# Patient Record
Sex: Female | Born: 1960 | Race: White | Hispanic: No | Marital: Married | Smoking: Never smoker
Health system: Southern US, Community
[De-identification: ages and names within clinical notes are randomized; demographics above are authoritative.]

## PROBLEM LIST (undated history)

## (undated) DIAGNOSIS — I10 Essential (primary) hypertension: Secondary | ICD-10-CM

## (undated) HISTORY — PX: ABDOMINAL HYSTERECTOMY: SHX81

---

## 2018-03-12 ENCOUNTER — Emergency Department
Admission: EM | Admit: 2018-03-12 | Discharge: 2018-03-12 | Disposition: A | Discharge status: Home / Self Care | Payer: Medicare FFS | Source: Home / Self Care

## 2018-03-12 ENCOUNTER — Emergency Department (INDEPENDENT_AMBULATORY_CARE_PROVIDER_SITE_OTHER): Payer: Medicare FFS

## 2018-03-12 ENCOUNTER — Encounter: Payer: Self-pay | Admitting: Emergency Medicine

## 2018-03-12 DIAGNOSIS — W230XXA Caught, crushed, jammed, or pinched between moving objects, initial encounter: Secondary | ICD-10-CM | POA: Diagnosis not present

## 2018-03-12 DIAGNOSIS — S6992XA Unspecified injury of left wrist, hand and finger(s), initial encounter: Secondary | ICD-10-CM

## 2018-03-12 DIAGNOSIS — S61311A Laceration without foreign body of left index finger with damage to nail, initial encounter: Secondary | ICD-10-CM

## 2018-03-12 HISTORY — DX: Essential (primary) hypertension: I10

## 2018-03-12 NOTE — Discharge Instructions (Addendum)
Return in 7 days for suture removal.

## 2018-03-12 NOTE — ED Triage Notes (Signed)
Patient states that she closed her left index finger in a door of a chicken house last PM and in another door inside her home today. Last tetanus 3 years ago.

## 2018-03-14 NOTE — ED Provider Notes (Addendum)
[facility]    CSN: [id number] Arrival date & time: 03/12/18  1631     History   Chief Complaint Chief Complaint  Patient presents with  . Laceration    HPI Jenna Underwood is a 57 y.o. female.   The history is provided by the patient. No language interpreter was used.  Laceration  Location:  Finger Finger laceration location:  L index finger Length:  1 Quality: avulsion   Bleeding: controlled   Laceration mechanism:  Unable to specify Pain details:    Quality:  Aching   Severity:  No pain   Timing:  Constant   Progression:  Worsening Foreign body present:  No foreign bodies Relieved by:  Nothing Worsened by:  Nothing Ineffective treatments:  None tried Tetanus status:  Up to date Pt complains of a laceration to tip of her finger.  Pt closed door on the tip of her finger.  Pt complains of pain and bleeding   Past Medical History:  Diagnosis Date  . Hypertension     There are no active problems to display for this patient.   Past Surgical History:  Procedure Laterality Date  . ABDOMINAL HYSTERECTOMY      OB History   None      Home Medications    Prior to Admission medications   Medication Sig Start Date End Date Taking? Authorizing Provider  cholecalciferol (VITAMIN D) 1000 units tablet Take 1,000 Units by mouth once a week.   Yes [provider]  levothyroxine (SYNTHROID) 100 MCG tablet Take 100 mcg by mouth daily before breakfast.   Yes [provider]  zolpidem (AMBIEN) 5 MG tablet Take 5 mg by mouth at bedtime as needed for sleep.   Yes [provider]    Family History History reviewed. No pertinent family history.  Social History Social History   Tobacco Use  . Smoking status: Never Smoker  . Smokeless tobacco: Never Used  Substance Use Topics  . Alcohol use: Never    Frequency: Never  . Drug use: Never     Allergies   Morphine and related   Review of Systems Review of Systems  All other  systems reviewed and are negative.    Physical Exam Triage Vital Signs ED Triage Vitals  Enc Vitals Group     BP 03/12/18 1647 (!) 151/98     Pulse Rate 03/12/18 1647 88     Resp 03/12/18 1647 16     Temp 03/12/18 1647 97.8 F (36.6 C)     Temp Source 03/12/18 1647 Oral     SpO2 03/12/18 1647 97 %     Weight 03/12/18 1648 158 lb (71.7 kg)     Height 03/12/18 1648 5\' 7"  (1.702 m)     Head Circumference --      Peak Flow --      Pain Score 03/12/18 1648 4     Pain Loc --      Pain Edu? --      Excl. in GC? --    No data found.  Updated Vital Signs BP (!) 151/98 (BP Location: Right Arm)   Pulse 88   Temp 97.8 F (36.6 C) (Oral)   Resp 16   Ht 5\' 7"  (1.702 m)   Wt 158 lb (71.7 kg)   SpO2 97%   BMI 24.75 kg/m   Visual Acuity Right Eye Distance:   Left Eye Distance:   Bilateral Distance:    Right Eye Near:  Left Eye Near:    Bilateral Near:     Physical Exam  Constitutional: She appears well-developed and well-nourished.  HENT:  Head: Normocephalic.  Musculoskeletal: Normal range of motion.  1cm laceration distal tip of left index finger  Neurological: She is alert.  Skin: Skin is warm.  Psychiatric: She has a normal mood and affect.  Nursing note and vitals reviewed.    UC Treatments / Results  Labs (all labs ordered are listed, but only abnormal results are displayed) Labs Reviewed - No data to display  EKG None Radiology Dg Finger Index Left  Result Date: 03/12/2018 CLINICAL DATA:  Pain after trauma EXAM: LEFT INDEX FINGER 2+V COMPARISON:  None. FINDINGS: There is no evidence of fracture or dislocation. There is no evidence of arthropathy or other focal bone abnormality. Soft tissues are unremarkable. IMPRESSION: Negative. Electronically Signed   By: Gerome Sam III M.D   On: 03/12/2018 17:37    Procedures Laceration Repair Date/Time: 03/14/2018 8:35 AM Performed by: Elson Areas, PA-C Authorized by: Elson Areas, PA-C   Consent:      Consent obtained:  Verbal   Consent given by:  Patient Laceration details:    Location:  Finger   Finger location:  L index finger   Length (cm):  1   Depth (mm):  5 Repair type:    Repair type:  Simple Pre-procedure details:    Preparation:  Patient was prepped and draped in usual sterile fashion Exploration:    Wound exploration: wound explored through full range of motion and entire depth of wound probed and visualized     Contaminated: no   Treatment:    Area cleansed with:  Betadine   Irrigation method:  Syringe Skin repair:    Repair method:  Sutures   Suture material:  Nylon   Suture technique:  Simple interrupted   Number of sutures:  3 Approximation:    Approximation:  Loose Post-procedure details:    Dressing:  Non-adherent dressing   (including critical care time)  Medications Ordered in UC Medications - No data to display   Initial Impression / Assessment and Plan / UC Course  I have reviewed the triage vital signs and the nursing notes.  Pertinent labs & imaging results that were available during my care of the patient were reviewed by me and considered in my medical decision making (see chart for details).     Suutre removal in 8 days  Final Clinical Impressions(s) / UC Diagnoses   Final diagnoses:  Laceration of left index finger with damage to nail, foreign body presence unspecified, initial encounter    ED Discharge Orders    None     An After Visit Summary was printed and given to the patient.   Controlled Substance Prescriptions [place] Controlled Substance Registry consulted? Not Applicable   Osie Cheeks 03/14/18 1610    Elson Areas, PA-C 03/14/18 [phone]

## 2018-03-16 ENCOUNTER — Telehealth: Payer: Self-pay | Admitting: Emergency Medicine

## 2018-03-16 NOTE — Telephone Encounter (Signed)
Patient states her finger is sore; taking Aleve; denies any signs of infection. Will return for suture removal as suggested.

## 2018-03-19 ENCOUNTER — Other Ambulatory Visit: Payer: Self-pay

## 2018-03-19 ENCOUNTER — Encounter: Payer: Self-pay | Admitting: Emergency Medicine

## 2018-03-19 ENCOUNTER — Emergency Department (INDEPENDENT_AMBULATORY_CARE_PROVIDER_SITE_OTHER): Payer: Medicare FFS

## 2018-03-19 ENCOUNTER — Emergency Department (INDEPENDENT_AMBULATORY_CARE_PROVIDER_SITE_OTHER)
Admission: EM | Admit: 2018-03-19 | Discharge: 2018-03-19 | Disposition: A | Discharge status: Home / Self Care | Payer: Medicare FFS | Source: Home / Self Care | Attending: Emergency Medicine | Admitting: Emergency Medicine

## 2018-03-19 DIAGNOSIS — Z23 Encounter for immunization: Secondary | ICD-10-CM | POA: Diagnosis not present

## 2018-03-19 DIAGNOSIS — S61209D Unspecified open wound of unspecified finger without damage to nail, subsequent encounter: Secondary | ICD-10-CM

## 2018-03-19 DIAGNOSIS — M50322 Other cervical disc degeneration at C5-C6 level: Secondary | ICD-10-CM | POA: Diagnosis not present

## 2018-03-19 DIAGNOSIS — S161XXA Strain of muscle, fascia and tendon at neck level, initial encounter: Secondary | ICD-10-CM | POA: Diagnosis not present

## 2018-03-19 MED ORDER — TETANUS-DIPHTH-ACELL PERTUSSIS 5-2.5-18.5 LF-MCG/0.5 IM SUSP
0.5000 mL | Freq: Once | INTRAMUSCULAR | Status: AC
  Administered 2018-03-19: 0.5 mL via INTRAMUSCULAR

## 2018-03-19 MED ORDER — MUPIROCIN CALCIUM 2 % EX CREA: 1.0000 "application " | TOPICAL_CREAM | Freq: Two times a day (BID) | CUTANEOUS | 0 refills | Status: DC

## 2018-03-19 NOTE — Discharge Instructions (Addendum)
Clean finger with soap and water 3 times a day. Use Bactroban ointment 3 times a day. I will call you with culture results.
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
## 2018-03-22 LAB — WOUND CULTURE
MICRO NUMBER: 90400131
SPECIMEN QUALITY: ADEQUATE

## 2022-03-01 ENCOUNTER — Emergency Department (INDEPENDENT_AMBULATORY_CARE_PROVIDER_SITE_OTHER): Payer: Medicare FFS

## 2022-03-01 ENCOUNTER — Other Ambulatory Visit: Payer: Self-pay

## 2022-03-01 ENCOUNTER — Emergency Department
Admission: EM | Admit: 2022-03-01 | Discharge: 2022-03-01 | Disposition: A | Discharge status: Home / Self Care | Payer: Medicare FFS | Source: Home / Self Care | Attending: Family Medicine | Admitting: Family Medicine

## 2022-03-01 DIAGNOSIS — M79671 Pain in right foot: Secondary | ICD-10-CM | POA: Diagnosis not present

## 2022-03-01 DIAGNOSIS — S9031XA Contusion of right foot, initial encounter: Secondary | ICD-10-CM

## 2022-03-01 DIAGNOSIS — W19XXXA Unspecified fall, initial encounter: Secondary | ICD-10-CM

## 2022-03-01 MED ORDER — TRAMADOL HCL 50 MG PO TABS: 50.0000 mg | ORAL_TABLET | Freq: Four times a day (QID) | ORAL | 0 refills | Status: AC | PRN

## 2022-03-01 NOTE — Discharge Instructions (Signed)
Use ice and elevation to reduce pain and swelling ?Take tramadol as needed for severe pain ?Limit walking while ankle is painful ?See your doctor if not improving by next week ?

## 2022-03-01 NOTE — ED Provider Notes (Signed)
?[facility] ? ? ? ?CSN: [id number] ?Arrival date & time: 03/01/22  1630 ? ? ?  ? ?History   ?Chief Complaint ?Chief Complaint  ?Patient presents with  ? Foot Pain  ?  RT, swelling  ? ? ?HPI ?Jenna Underwood is a 61 y.o. female.  ? ?HPI ?Patient states she fell in her home 4 days ago and injured her left foot.  Tripped over a dog.  The outside of her foot and ankle is swollen and very painful.  Pain with weightbearing.  No history of fracture or injury in the past.  No history of osteoporosis.  Patient is disabled with chronic pain and fibromyalgia ?Past Medical History:  ?Diagnosis Date  ? Hypertension   ? ? ?There are no problems to display for this patient. ? ? ?Past Surgical History:  ?Procedure Laterality Date  ? ABDOMINAL HYSTERECTOMY    ? ? ?OB History   ?No obstetric history on file. ?  ? ? ? ?Home Medications   ? ?Prior to Admission medications   ?Medication Sig Start Date End Date Taking? Authorizing Provider  ?Olopatadine HCl 0.2 % SOLN Place one drop into both eyes daily for 30 days. 10/07/20  Yes [provider]  ?venlafaxine XR (EFFEXOR-XR) 150 MG 24 hr capsule TAKE 1 CAPSULE WITH BREAKFAST 07/18/20  Yes [provider]  ?cholecalciferol (VITAMIN D) 1000 units tablet Take 1,000 Units by mouth once a week.    [provider]  ?furosemide (LASIX) 20 MG tablet Take 20 mg by mouth daily as needed. 11/05/21   [provider]  ?levothyroxine (SYNTHROID) 100 MCG tablet Take 100 mcg by mouth daily before breakfast.    [provider]  ?traMADol (ULTRAM) 50 MG tablet Take 1 tablet (50 mg total) by mouth every 6 (six) hours as needed. 03/01/22  Yes Eustace Moore, MD  ?zolpidem (AMBIEN) 5 MG tablet Take 5 mg by mouth at bedtime as needed for sleep.    [provider]  ? ? ?Family History ?History reviewed. No pertinent family history. ? ?Social History ?Social History  ? ?Tobacco Use  ? Smoking status: Never  ? Smokeless tobacco: Never  ?Substance Use  Topics  ? Alcohol use: Never  ? Drug use: Never  ? ? ? ?Allergies   ?Morphine and related and Gabapentin ? ? ?Review of Systems ?Review of Systems ? ?See HPI ?Physical Exam ?Triage Vital Signs ?ED Triage Vitals  ?Enc Vitals Group  ?   BP 03/01/22 1643 121/73  ?   Pulse Rate 03/01/22 1643 85  ?   Resp 03/01/22 1643 18  ?   Temp 03/01/22 1643 98.3 ?F (36.8 ?C)  ?   Temp Source 03/01/22 1643 Oral  ?   SpO2 03/01/22 1643 97 %  ?   Weight --   ?   Height --   ?   Head Circumference --   ?   Peak Flow --   ?   Pain Score 03/01/22 1644 5  ?   Pain Loc --   ?   Pain Edu? --   ?   Excl. in GC? --   ? ?No data found. ? ?Updated Vital Signs ?BP 121/73 (BP Location: Right Arm)   Pulse 85   Temp 98.3 ?F (36.8 ?C) (Oral)   Resp 18   SpO2 97%  ?  ? ?Physical Exam ?Constitutional:   ?   General: She is not in acute distress. ?   Appearance: She is  well-developed.  ?HENT:  ?   Head: Normocephalic and atraumatic.  ?   Nose:  ?   Comments: Mask is in place ?Eyes:  ?   Conjunctiva/sclera: Conjunctivae normal.  ?   Pupils: Pupils are equal, round, and reactive to light.  ?Cardiovascular:  ?   Rate and Rhythm: Normal rate.  ?Pulmonary:  ?   Effort: Pulmonary effort is normal. No respiratory distress.  ?Abdominal:  ?   General: There is no distension.  ?   Palpations: Abdomen is soft.  ?Musculoskeletal:     ?   General: Normal range of motion.  ?   Cervical back: Normal range of motion.  ?     Feet: ? ?Skin: ?   General: Skin is warm and dry.  ?Neurological:  ?   Mental Status: She is alert.  ?   Gait: Gait abnormal.  ?Psychiatric:     ?   Mood and Affect: Mood normal.     ?   Behavior: Behavior normal.  ? ? ? ?UC Treatments / Results  ?Labs ?(all labs ordered are listed, but only abnormal results are displayed) ?Labs Reviewed - No data to display ? ?EKG ? ? ?Radiology ?DG Foot Complete Right ? ?Result Date: 03/01/2022 ?CLINICAL DATA:  Trip and fall, lateral foot pain EXAM: RIGHT FOOT COMPLETE - 3+ VIEW COMPARISON:  None. FINDINGS:  There is no evidence of fracture or dislocation. There is no evidence of arthropathy or other focal bone abnormality. Soft tissues are unremarkable. IMPRESSION: No fracture or dislocation of the right foot. Joint spaces are preserved. Electronically Signed   By: Jearld Lesch M.D.   On: 03/01/2022 17:05   ? ?Procedures ?Procedures (including critical care time) ? ?Medications Ordered in UC ?Medications - No data to display ? ?Initial Impression / Assessment and Plan / UC Course  ?I have reviewed the triage vital signs and the nursing notes. ? ?Pertinent labs & imaging results that were available during my care of the patient were reviewed by me and considered in my medical decision making (see chart for details). ? ?  ? ?Final Clinical Impressions(s) / UC Diagnoses  ? ?Final diagnoses:  ?Contusion of right foot, initial encounter  ? ? ? ?Discharge Instructions   ? ?  ?Use ice and elevation to reduce pain and swelling ?Take tramadol as needed for severe pain ?Limit walking while ankle is painful ?See your doctor if not improving by next week ? ? ?ED Prescriptions   ? ? Medication Sig Dispense Auth. Provider  ? traMADol (ULTRAM) 50 MG tablet Take 1 tablet (50 mg total) by mouth every 6 (six) hours as needed. 15 tablet Eustace Moore, MD  ? ?  ? ?I have reviewed the PDMP during this encounter. ?  ?Eustace Moore, MD ?03/01/22 1745 ? ?

## 2022-03-01 NOTE — ED Triage Notes (Signed)
Pt c/o LT foot pain since Sat evening. Says she tripped over one of her dogs hitting the side of her foot on her back deck. Pain 5/10 Advil and ibuprofen prn. Ice and epsom salt prn. ?

## 2022-10-12 IMAGING — DX DG FOOT COMPLETE 3+V*R*
3 series · 3 of 3 positions shown · non-contrast
Comparison: None.

CLINICAL DATA: Trip and fall, lateral foot pain

EXAM:
RIGHT FOOT COMPLETE - 3+ VIEW

[foot ap]
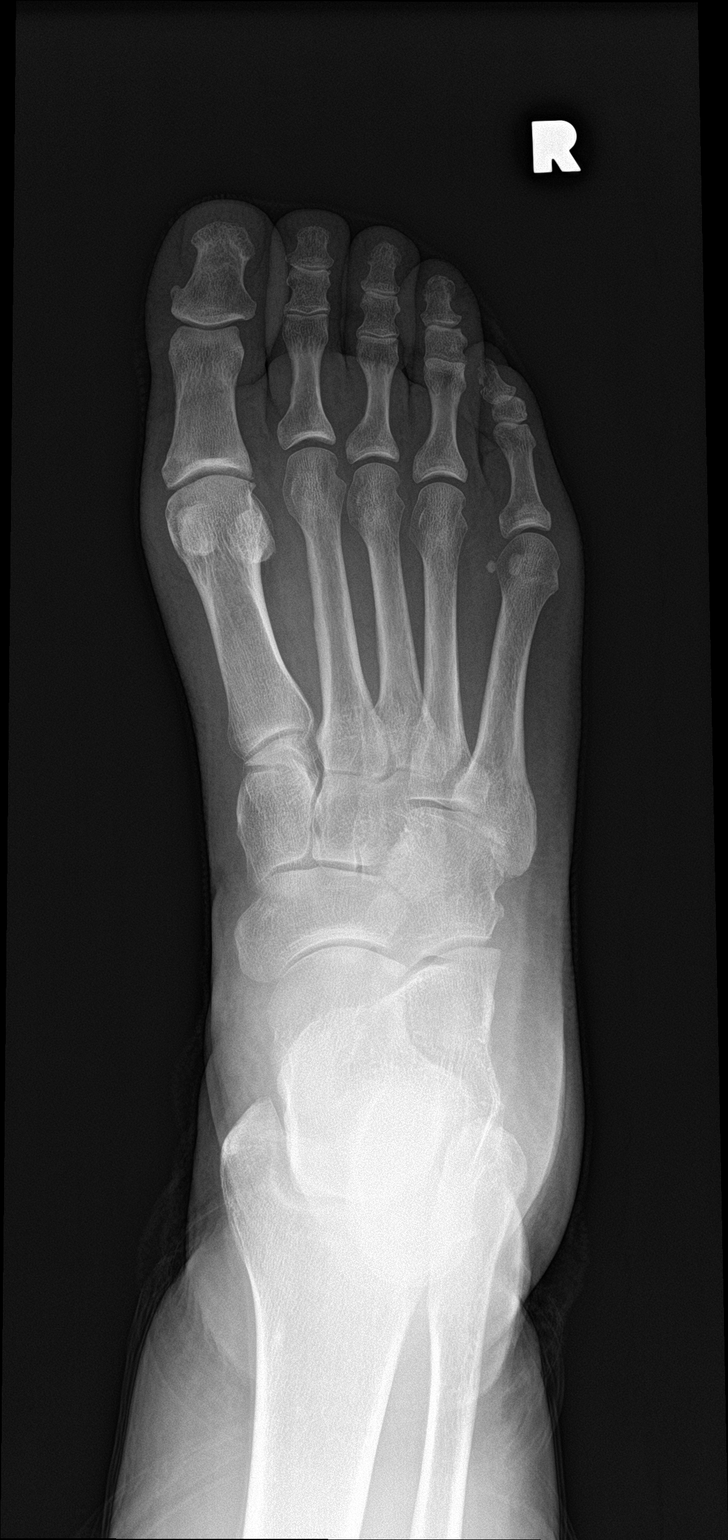

[foot obl]
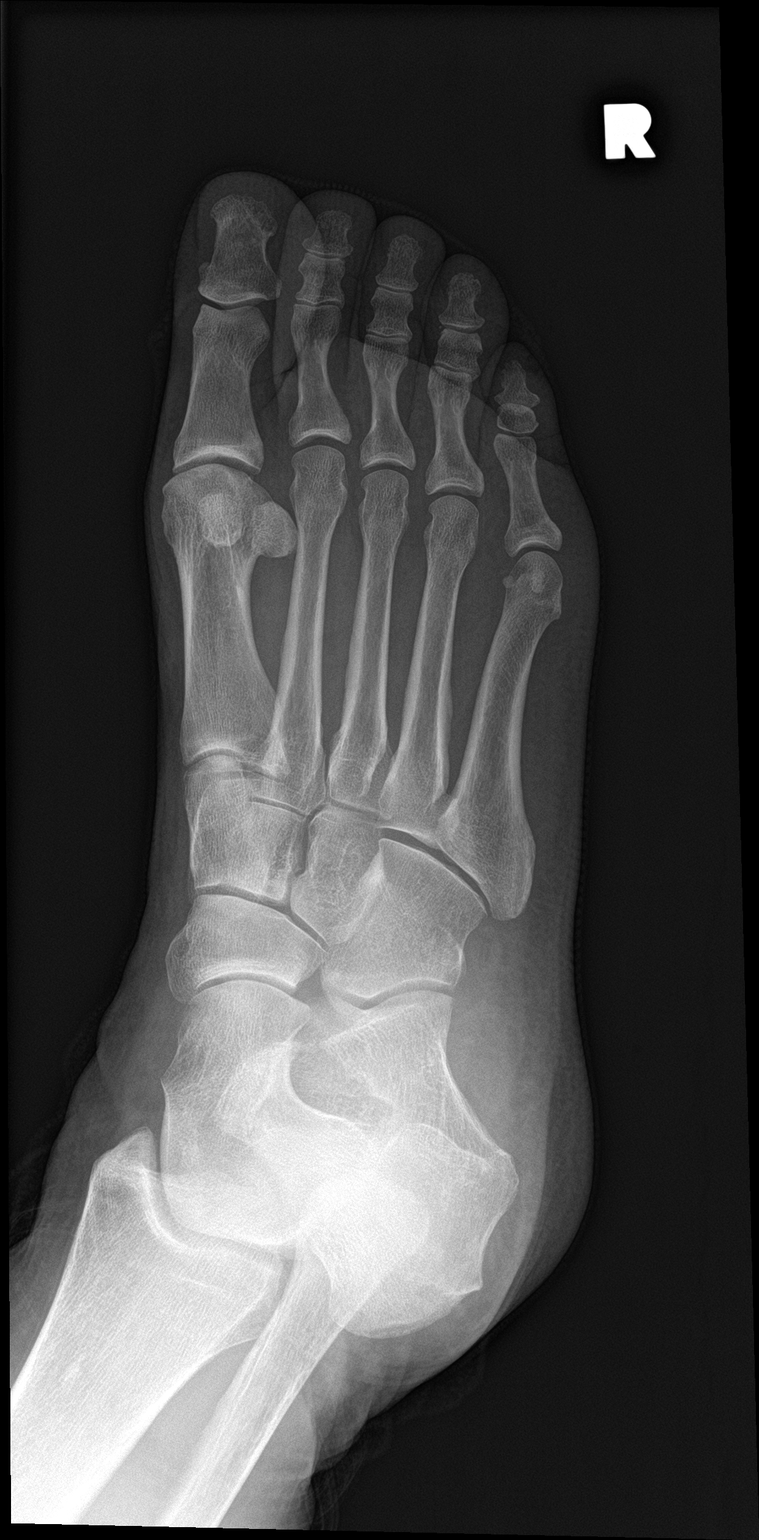

[foot lat]
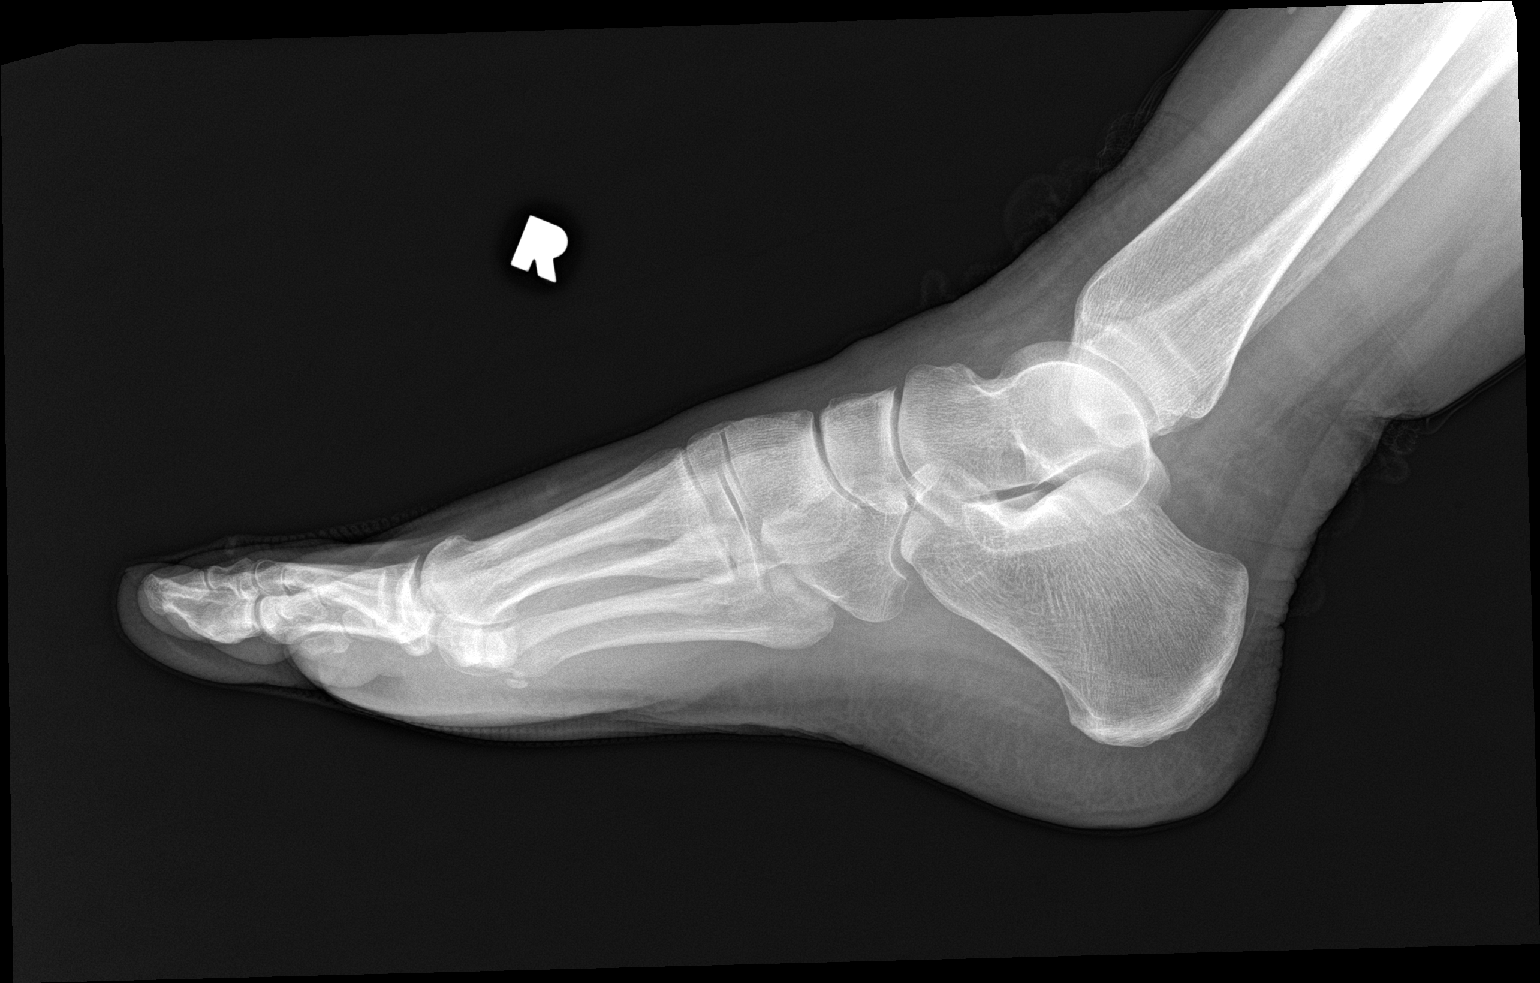

[3 of 3 positions shown; findings below may reference images not displayed]

FINDINGS: There is no evidence of fracture or dislocation. There is no
evidence of arthropathy or other focal bone abnormality. Soft
tissues are unremarkable.
IMPRESSION: No fracture or dislocation of the right foot. Joint spaces are
preserved.
